# Patient Record
Sex: Male | Born: 1980 | Race: White | Hispanic: No | Marital: Single | Smoking: Never smoker
Health system: Southern US, Community
[De-identification: ages and names within clinical notes are randomized; demographics above are authoritative.]

## PROBLEM LIST (undated history)

## (undated) ENCOUNTER — Emergency Department (HOSPITAL_COMMUNITY): Discharge status: Absent without leave | Payer: Self-pay

## (undated) DIAGNOSIS — R112 Nausea with vomiting, unspecified: Secondary | ICD-10-CM

## (undated) DIAGNOSIS — Z9889 Other specified postprocedural states: Secondary | ICD-10-CM

## (undated) DIAGNOSIS — M109 Gout, unspecified: Secondary | ICD-10-CM

## (undated) DIAGNOSIS — E7800 Pure hypercholesterolemia, unspecified: Secondary | ICD-10-CM

## (undated) HISTORY — PX: TIBIALIS TENDON TRANSFER / REPAIR: SHX6630

## (undated) HISTORY — PX: OTHER SURGICAL HISTORY: SHX169

## (undated) HISTORY — PX: WISDOM TOOTH EXTRACTION: SHX21

---
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
## 2015-01-06 ENCOUNTER — Ambulatory Visit (INDEPENDENT_AMBULATORY_CARE_PROVIDER_SITE_OTHER): Payer: PRIVATE HEALTH INSURANCE | Admitting: Podiatrist

## 2015-02-04 ENCOUNTER — Other Ambulatory Visit: Payer: Self-pay | Admitting: Podiatrist

## 2015-02-04 MED ORDER — PREDNISONE 10 MG (21) PO TBPK: 10.0000 mg | ORAL_TABLET | Freq: Every day | ORAL | Status: DC

## 2015-02-04 MED ORDER — INDOMETHACIN 50 MG PO CAPS: 50.0000 mg | ORAL_CAPSULE | Freq: Three times a day (TID) | ORAL | Status: DC

## 2015-02-04 MED ORDER — COLCHICINE 0.6 MG PO TABS: 0.6000 mg | ORAL_TABLET | Freq: Every day | ORAL | Status: DC

## 2015-02-04 MED ORDER — COLCHICINE 0.6 MG PO TABS: 0.6000 mg | ORAL_TABLET | Freq: Every day | ORAL | Status: AC

## 2016-06-26 ENCOUNTER — Ambulatory Visit: Payer: PRIVATE HEALTH INSURANCE | Admitting: Rheumatology

## 2016-07-11 ENCOUNTER — Ambulatory Visit (INDEPENDENT_AMBULATORY_CARE_PROVIDER_SITE_OTHER): Payer: PRIVATE HEALTH INSURANCE | Admitting: Rheumatology

## 2016-07-11 DIAGNOSIS — M064 Inflammatory polyarthropathy: Secondary | ICD-10-CM

## 2016-07-11 DIAGNOSIS — R6889 Other general symptoms and signs: Secondary | ICD-10-CM | POA: Diagnosis not present

## 2016-07-11 DIAGNOSIS — M25531 Pain in right wrist: Secondary | ICD-10-CM

## 2016-07-11 DIAGNOSIS — M25571 Pain in right ankle and joints of right foot: Secondary | ICD-10-CM | POA: Diagnosis not present

## 2016-07-11 DIAGNOSIS — M1A00X0 Idiopathic chronic gout, unspecified site, without tophus (tophi): Secondary | ICD-10-CM

## 2016-08-14 ENCOUNTER — Other Ambulatory Visit: Payer: Self-pay | Admitting: Rheumatology

## 2016-08-14 MED ORDER — ALLOPURINOL 300 MG PO TABS: 300.0000 mg | ORAL_TABLET | Freq: Every day | ORAL | 1 refills | Status: AC

## 2016-08-14 NOTE — Telephone Encounter (Signed)
07/11/16 last visit Allopurinol 300mg  was sent in 27mo supply have resetn

## 2016-08-14 NOTE — Telephone Encounter (Signed)
[facility] [address]

## 2016-10-11 ENCOUNTER — Ambulatory Visit: Payer: PRIVATE HEALTH INSURANCE | Admitting: Rheumatology

## 2019-01-16 MED FILL — ATORVASTATIN 10 MG TABLET: 10 | 30 days supply | Qty: 30 | Fill #0

## 2019-01-16 MED FILL — ALLOPURINOL 100 MG TABS: 100 | 30 days supply | Qty: 30 | Fill #0

## 2019-02-12 MED FILL — ATORVASTATIN 10 MG TABLET: 10 | 30 days supply | Qty: 30 | Fill #1

## 2019-02-12 MED FILL — ALLOPURINOL 100 MG TABS: 100 | 30 days supply | Qty: 30 | Fill #1

## 2019-02-13 MED FILL — COLCRYS 0.6 MG TABLET: 0.6 | 15 days supply | Qty: 30 | Fill #0

## 2019-03-17 MED FILL — ATORVASTATIN 10 MG TABLET: 10 | 90 days supply | Qty: 90 | Fill #0

## 2019-03-17 MED FILL — ALLOPURINOL 100 MG TABS: 100 | 90 days supply | Qty: 90 | Fill #0

## 2019-06-27 MED FILL — ATORVASTATIN 10 MG TABLET: 10 | 90 days supply | Qty: 90 | Fill #1

## 2019-06-27 MED FILL — ALLOPURINOL 100 MG TABS: 100 | 90 days supply | Qty: 90 | Fill #1

## 2019-10-07 MED FILL — ATORVASTATIN 10 MG TABLET: 10 | 90 days supply | Qty: 90 | Fill #0

## 2019-10-07 MED FILL — ALLOPURINOL 100 MG TABS: 100 | 90 days supply | Qty: 90 | Fill #0

## 2019-11-07 ENCOUNTER — Ambulatory Visit: Payer: Self-pay

## 2019-11-19 ENCOUNTER — Ambulatory Visit: Payer: Self-pay | Attending: Internal Medicine

## 2019-11-19 DIAGNOSIS — Z23 Encounter for immunization: Secondary | ICD-10-CM | POA: Insufficient documentation

## 2019-11-19 NOTE — Progress Notes (Signed)
   Covid-19 Vaccination Clinic  Name:  Rylen Swindler    MRN: [id number] DOB: June 13, 1981  11/19/2019  Mr. Farruggia was observed post Covid-19 immunization for 15 minutes without incidence. He was provided with Vaccine Information Sheet and instruction to access the V-Safe system.   Mr. Showers was instructed to call 911 with any severe reactions post vaccine: Marland Kitchen Difficulty breathing  . Swelling of your face and throat  . A fast heartbeat  . A bad rash all over your body  . Dizziness and weakness    Immunizations Administered    Name Date Dose VIS Date Route   Pfizer COVID-19 Vaccine 11/19/2019  8:51 AM 0.3 mL 09/04/2019 Intramuscular   Manufacturer: [employer], [employer]   Lot: [id number]   NDC: [address]-9

## 2019-12-09 ENCOUNTER — Ambulatory Visit: Payer: Self-pay | Attending: Internal Medicine

## 2019-12-09 DIAGNOSIS — Z23 Encounter for immunization: Secondary | ICD-10-CM

## 2019-12-09 NOTE — Progress Notes (Signed)
   Covid-19 Vaccination Clinic  Name:  Aubert Choyce    MRN: [id number] DOB: 08/19/1981  12/09/2019  Mr. Venable was observed post Covid-19 immunization for 15 minutes without incident. He was provided with Vaccine Information Sheet and instruction to access the V-Safe system.   Mr. Maule was instructed to call 911 with any severe reactions post vaccine: Marland Kitchen Difficulty breathing  . Swelling of face and throat  . A fast heartbeat  . A bad rash all over body  . Dizziness and weakness   Immunizations Administered    Name Date Dose VIS Date Route   Pfizer COVID-19 Vaccine 12/09/2019 11:36 AM 0.3 mL 09/04/2019 Intramuscular   Manufacturer: [employer], [employer]   Lot: [id number]   NDC: [address]-2

## 2019-12-30 ENCOUNTER — Emergency Department (HOSPITAL_COMMUNITY): Payer: PRIVATE HEALTH INSURANCE

## 2019-12-30 ENCOUNTER — Encounter (HOSPITAL_COMMUNITY): Payer: Self-pay | Admitting: Emergency Medicine

## 2019-12-30 ENCOUNTER — Emergency Department (HOSPITAL_COMMUNITY)
Admission: EM | Admit: 2019-12-30 | Discharge: 2019-12-30 | Disposition: A | Discharge status: Home / Self Care | Payer: PRIVATE HEALTH INSURANCE | Attending: Emergency Medicine | Admitting: Emergency Medicine

## 2019-12-30 ENCOUNTER — Other Ambulatory Visit: Payer: Self-pay

## 2019-12-30 DIAGNOSIS — L539 Erythematous condition, unspecified: Secondary | ICD-10-CM | POA: Insufficient documentation

## 2019-12-30 DIAGNOSIS — M25521 Pain in right elbow: Secondary | ICD-10-CM

## 2019-12-30 LAB — CBC
HCT: 42.5 % (ref 39.0–52.0)
Hemoglobin: 13.9 g/dL (ref 13.0–17.0)
MCH: 28.3 pg (ref 26.0–34.0)
MCHC: 32.7 g/dL (ref 30.0–36.0)
MCV: 86.4 fL (ref 80.0–100.0)
Platelets: 252 10*3/uL (ref 150–400)
RBC: 4.92 MIL/uL (ref 4.22–5.81)
RDW: 12.4 % (ref 11.5–15.5)
WBC: 6.8 10*3/uL (ref 4.0–10.5)
nRBC: 0 % (ref 0.0–0.2)

## 2019-12-30 LAB — COMPREHENSIVE METABOLIC PANEL
ALT: 31 U/L (ref 0–44)
AST: 21 U/L (ref 15–41)
Albumin: 4.2 g/dL (ref 3.5–5.0)
Alkaline Phosphatase: 62 U/L (ref 38–126)
Anion gap: 11 (ref 5–15)
BUN: 19 mg/dL (ref 6–20)
CO2: 23 mmol/L (ref 22–32)
Calcium: 9.2 mg/dL (ref 8.9–10.3)
Chloride: 105 mmol/L (ref 98–111)
Creatinine, Ser: 0.95 mg/dL (ref 0.61–1.24)
GFR calc Af Amer: >60 mL/min (ref >60)
GFR calc non Af Amer: >60 mL/min (ref >60)
Glucose, Bld: 94 mg/dL (ref 70–99)
Potassium: 4.1 mmol/L (ref 3.5–5.1)
Sodium: 139 mmol/L (ref 135–145)
Total Bilirubin: 0.6 mg/dL (ref 0.3–1.2)
Total Protein: 7.4 g/dL (ref 6.5–8.1)

## 2019-12-30 LAB — C-REACTIVE PROTEIN: CRP: 2.9 mg/dL — ABNORMAL HIGH (ref <1.0)

## 2019-12-30 LAB — SEDIMENTATION RATE: Sed Rate: 18 mm/hr — ABNORMAL HIGH (ref 0–16)

## 2019-12-30 LAB — URIC ACID: Uric Acid, Serum: 8.2 mg/dL (ref 3.7–8.6)

## 2019-12-30 MED ORDER — SODIUM CHLORIDE 0.9 % IV BOLUS
1000.0000 mL | Freq: Once | INTRAVENOUS | Status: AC
  Administered 2019-12-30: 1000 mL via INTRAVENOUS

## 2019-12-30 MED ORDER — GADOBUTROL 1 MMOL/ML IV SOLN
10.0000 mL | Freq: Once | INTRAVENOUS | Status: AC | PRN
  Administered 2019-12-30: 10 mL via INTRAVENOUS

## 2019-12-30 MED ORDER — SODIUM CHLORIDE 0.9 % IV SOLN
2.0000 g | Freq: Once | INTRAVENOUS | Status: AC
  Administered 2019-12-30: 2 g via INTRAVENOUS
  Filled 2019-12-30: qty 20

## 2019-12-30 NOTE — ED Provider Notes (Signed)
Patient sent here by Dr. Darrelyn Hillock of [facility] for labs, IV antibiotics, MRI of the right elbow.  Recurrent cellulitis, MRI to exclude septic joint.  Dr. Darrelyn Hillock can be reached at [phone] for updates.  Dr. Darrelyn Hillock feels the patient can likely be managed as an outpatient.  Patient is currently in the waiting room, work-up ordered.  Elmer Sow. Pilar Plate, MD [facility] [email or web address]    Sabas Sous, MD 12/30/19 [phone]

## 2019-12-30 NOTE — ED Notes (Signed)
Pt comes out of the room and says " I'm gonna take a piss myself since noone will help," he was asked had he been calling out because the staff that were present at his time of outburst had not heard a request that he needed to go to the bathroom

## 2019-12-30 NOTE — ED Notes (Signed)
Pt taken to MRI  

## 2019-12-30 NOTE — ED Provider Notes (Signed)
[facility] DEPT Provider Note   CSN: [id number] Arrival date & time: 12/30/19  1554     History Chief Complaint  Patient presents with  . Arm Swelling    redness    Reginald Calderon is a 39 y.o. male presents for evaluation of acute onset, persistent right elbow pain and swelling.  He reports that 3 weeks ago on [place] Patrick[place] Day he received the second dose of the Pfizer Covid vaccine to the right upper extremity.  He states that the following day he began to develop swelling, erythema and pain of the left elbow as well as fevers up to 101 F.  The day after that he awoke with a gout flare involving his right ankle, right knee, and right wrist.  He was seen and evaluated by his orthopedist that same day and was started on a 7-day course of doxycycline twice daily.  He reports that the following Monday he was reevaluated and started on a course of prednisone and had significant improvement in his gout flare since then.  He has had gout previously and states that the pain in multiple joints on the right side felt the same.  He notes that by the end of the weekend of 26 March the gout flare and the pain swelling and erythema to the right elbow had resolved.  2 days ago he developed recurrent pain swelling and erythema to the right elbow.  He denies any fevers, numbness or weakness.  The pain will radiate down to the right wrist particularly with flexion of the right wrist.  He denies chest pain, shortness of breath, abdominal pain, nausea, or vomiting.  He has been taking ibuprofen with some improvement in his symptoms.  His orthopedist prescribed him a course of Keflex which he began yesterday and has had 3 doses thus far.  He was seen and evaluated by Dr. Gladstone Lighter in the office today who recommended presentation to the ED for MRI.  The history is provided by the patient.       History reviewed. No pertinent past medical history.  There are no problems to display for  this patient.   History reviewed. No pertinent surgical history.     No family history on file.  Social History   Tobacco Use  . Smoking status: Not on file  Substance Use Topics  . Alcohol use: Not Currently  . Drug use: Not Currently    Home Medications Prior to Admission medications   Medication Sig Start Date End Date Taking? Authorizing Provider  allopurinol (ZYLOPRIM) 100 MG tablet Take 100 mg by mouth every evening. 10/07/19  Yes [provider]  atorvastatin (LIPITOR) 10 MG tablet Take 10 mg by mouth daily. 10/07/19  Yes [provider]  cephALEXin (KEFLEX) 500 MG capsule Take 500 mg by mouth 4 (four) times daily. 12/29/19  Yes [provider]  ibuprofen (ADVIL) 200 MG tablet Take 800 mg by mouth every 6 (six) hours as needed for headache, mild pain or moderate pain.   Yes [provider]  allopurinol (ZYLOPRIM) 300 MG tablet Take 1 tablet (300 mg total) by mouth daily. Patient not taking: Reported on 12/30/2019 08/14/16   Eliezer Lofts, PA-C  colchicine 0.6 MG tablet Take 1 tablet (0.6 mg total) by mouth daily. Take at first sign of gout flare (may take with steroid or nsaid) Patient not taking: Reported on 12/30/2019 02/04/15   Bronson Ing, DPM  indomethacin (INDOCIN) 50 MG capsule Take 1 capsule (  50 mg total) by mouth 3 (three) times daily with meals. Take for a mild gout symptoms- be sure to take with food Patient not taking: Reported on 12/30/2019 02/04/15   Bronson Ing, DPM  predniSONE (STERAPRED UNI-PAK 21 TAB) 10 MG (21) TBPK tablet Take 1 tablet (10 mg total) by mouth daily. Take for moderate to severe gout.  Do not take with other NSAIDS Patient not taking: Reported on 12/30/2019 02/04/15   Bronson Ing, DPM    Allergies    Patient has no known allergies.  Review of Systems   Review of Systems  Constitutional: Negative for chills and fever (resolved).  Respiratory: Negative for shortness of breath.     Cardiovascular: Negative for chest pain.  Gastrointestinal: Negative for abdominal pain, nausea and vomiting.  Musculoskeletal: Positive for arthralgias and joint swelling.  Skin: Positive for color change.  Neurological: Negative for weakness and numbness.  All other systems reviewed and are negative.   Physical Exam Updated Vital Signs BP (!) 123/95   Pulse 92   Temp 98.4 F (36.9 C) (Oral)   Resp 15   Ht '6\' 1"'  (1.854 m)   Wt 99.8 kg   SpO2 97%   BMI 29.03 kg/m   Physical Exam Vitals and nursing note reviewed.  Constitutional:      General: He is not in acute distress.    Appearance: He is well-developed.  HENT:     Head: Normocephalic and atraumatic.  Eyes:     General:        Right eye: No discharge.        Left eye: No discharge.     Conjunctiva/sclera: Conjunctivae normal.  Neck:     Vascular: No JVD.     Trachea: No tracheal deviation.  Cardiovascular:     Rate and Rhythm: Normal rate and regular rhythm.     Pulses: Normal pulses.     Comments: 2+ radial and DP/PT pulses bilaterally, Homans sign absent bilaterally, no lower extremity edema, no palpable cords, compartments are soft  Pulmonary:     Effort: Pulmonary effort is normal.     Breath sounds: Normal breath sounds.  Abdominal:     General: There is no distension.     Palpations: Abdomen is soft.  Musculoskeletal:        General: Swelling and tenderness present.     Comments: Mildly decreased active range of motion at the right elbow, worse pain with flexion than extension.  He has erythema extending from the posterior aspect of the right elbow down to the proximal forearm.  He is maximally tender to palpation along the right medial olecranon process.  The olecranon bursa does not appear markedly edematous.  5/5 strength of BUE major muscle groups, but pain to the elbow elicited with extension against resistance. He also has some tenderness to palpation of the volar aspect of the right wrist but no  swelling, erythema, or reduced range of motion. No crepitus or deformity.   Skin:    General: Skin is warm and dry.     Findings: No erythema.  Neurological:     Mental Status: He is alert.     Comments: Sensation intact to light touch of upper and lower extremities bilaterally  Psychiatric:        Behavior: Behavior normal.     ED Results / Procedures / Treatments   Labs (all labs ordered are listed, but only abnormal results are displayed) Labs Reviewed  SEDIMENTATION RATE -  Abnormal; Notable for the following components:      Result Value   Sed Rate 18 (*)    All other components within normal limits  C-REACTIVE PROTEIN - Abnormal; Notable for the following components:   CRP 2.9 (*)    All other components within normal limits  CULTURE, BLOOD (SINGLE)  CBC  COMPREHENSIVE METABOLIC PANEL  URIC ACID    EKG None  Radiology No results found.  Procedures Procedures (including critical care time)  Medications Ordered in ED Medications  sodium chloride 0.9 % bolus 1,000 mL (0 mLs Intravenous Stopped 12/30/19 2219)  cefTRIAXone (ROCEPHIN) 2 g in sodium chloride 0.9 % 100 mL IVPB (0 g Intravenous Stopped 12/30/19 2015)  gadobutrol (GADAVIST) 1 MMOL/ML injection 10 mL (10 mLs Intravenous Contrast Given 12/30/19 2252)    ED Course  I have reviewed the triage vital signs and the nursing notes.  Pertinent labs & imaging results that were available during my care of the patient were reviewed by me and considered in my medical decision making (see chart for details).  Clinical Course as of Dec 30 2343  Wed Dec 30, 2019  1919 Patient seen by myself as well as PA provider.  Briefly is a 39 year old male with a history of gout which has had in his ankle and his right hand, presented to emergency department with pain and swelling in his right elbow.  Reports this began approximately 2 weeks ago with his right ankle and his right wrist.  He completed a course of prednisone has  significant provement all of his symptoms.  However over the past weekend right elbow became swollen and red again.  He went to see an orthopedist who affirmed the emergency department for blood tests and an MRI of the elbow to evaluate for possible septic joint.  On my exam the patient is comfortable.  He is afebrile.  His vitals are within normal limits.  He does have some mild swelling and redness around the right elbow, with a small and tender effusion of the lateral elbow.  He is able to perform full range of motion both passively and actively with minimal pain.  Based on this presentation, I have a lower concern for septic arthritis.  I think this most consistent with gout or pseudogout.  He has no history or risk factors consistent with gonorrhea.  He has no systemic findings concerning for sepsis.  We plan to obtain the MRI as recommended by his orthopedist and discuss further management options with the orthopedist.   [MT]    Clinical Course User Index [MT] Trifan, Carola Rhine, MD   MDM Rules/Calculators/A&P                      Patient presenting sent from orthopedist office for evaluation of right elbow pain and swelling beginning for 2 days.  Orthopedist is requesting MRI for further evaluation.  The patient is afebrile, vital signs are stable in the ED.  He is neurovascularly intact and compartments are soft.  No concern for necrotizing fasciitis, DVT, osteomyelitis.  He was recently treated for a gout flare though symptoms today involving the right elbow seem atypical for gout.  He has fairly preserved active range of motion of the right elbow joint.  Lab work obtained at triage reviewed and interpreted by myself shows no leukocytosis, no anemia, no metabolic derangements, no renal insufficiency.  ESR and CRP are mildly elevated but these are nonspecific findings.  In the absence  of leukocytosis or constitutional symptoms, and with mostly preserved range of motion I do not feel that his  work-up today is suggestive of septic arthritis.  6:54 PM CONSULT: Spoke with Dr. Gladstone Lighter with orthopedics.  He would like for the patient to receive IV antibiotics and also requested addition of a serum uric acid level.  This was found to be normal in the ED.  The patient was given IV Rocephin in the ED.   9:27PM Dr. Gladstone Lighter called to check in on the status of the MRI.  The patient has not yet been to MRI.  He advises that the patient may be discharged from the ED upon completion of the MRI and does not need to wait for the results.  He advises that he will review the images independently in the morning and that he would like to see the patient in the office tomorrow afternoon.  He would like the patient to continue taking his antibiotics.  On reevaluation patient is resting in bed.  He was informed of Dr. Charlestine Night recommendations and agrees with plan at this time.  He will be discharged when his MRI is completed.  We discussed that he should continue taking the prescribed antibiotics.  Also discussed conservative therapy and management with NSAIDs, Tylenol.  He understands to follow-up with Dr. Gladstone Lighter in the office tomorrow afternoon.  Discussed strict ED return precautions.  Patient verbalized understanding of and agreement with plan and will be stable for discharge home pending MRI.  Patient was seen and evaluated by Dr. Langston Masker who agrees with assessment and plan at this time  Final Clinical Impression(s) / ED Diagnoses Final diagnoses:  Right elbow pain  Erythema of skin    Rx / DC Orders ED Discharge Orders    None       Debroah Baller 12/30/19 2355    Wyvonnia Dusky, MD 12/31/19 [phone]

## 2019-12-30 NOTE — Discharge Instructions (Signed)
You can take 1 to 2 tablets of Tylenol (350mg -1000mg  depending on the dose) every 6 hours as needed for pain.  Do not exceed 4000 mg of Tylenol daily.  If your pain persists you can take a doses of ibuprofen in between doses of Tylenol.  I usually recommend 400 to 600 mg of ibuprofen every 6 hours.  Take this with food to avoid upset stomach issues.  Please take all of your antibiotics until finished!   Take your antibiotics with food.  Common side effects of antibiotics include nausea, vomiting, abdominal discomfort, and diarrhea. You may help offset some of this with probiotics which you can buy or get in yogurt. Do not eat  or take the probiotics until 2 hours after your antibiotic.    Dr. has advised that he will review your images in the morning and recommends discharge from the emergency department. He would like to see you in the office tomorrow and recommends arriving at the office between 2:00 and 2:30 PM.  Return to the emergency department if any concerning signs or symptoms develop such as fevers, severe swelling, inability to range the elbow or weakness.

## 2019-12-30 NOTE — ED Triage Notes (Signed)
Arrived C/C R arm/elbow tissue swelling, pain and inflammation. States this has progressed since he got his last COVID shot on St. Patrick's day. Has had prednisone and abx, and it has returned.

## 2019-12-30 NOTE — ED Notes (Signed)
Bero, EDP made aware patient needs MRI and labs sent by pcp.

## 2019-12-30 NOTE — ED Notes (Signed)
Patient came out of room agitated and demanding this RN take his IV out now. Followed patient in to room to inquire why the change in disposition as patient was pleasant and cooperative at shift change. Patient said "I've been waiting 30 minutes to piss and I was told you were too busy to take me to the bathroom. I almost pissed in the sink. I was also told at shift change I was next for MRI and still waiting." Deescalated situation and patient later apologized. Called MRI who informed this RN that a patient needed a stat MRI and bumped Reginald Calderon. Informed patient. Patient is now in MRI.

## 2019-12-30 NOTE — ED Notes (Signed)
Patient took 800 mg of his own supply of ibuprofen for arm pain, per PA approval. Gave gingerale and offered an ice pack. Patient says Orthopedic MD said not to ice so he denies ice

## 2019-12-31 ENCOUNTER — Ambulatory Visit (HOSPITAL_COMMUNITY)
Admission: RE | Admit: 2019-12-31 | Discharge: 2019-12-31 | Disposition: A | Discharge status: Home / Self Care | Payer: PRIVATE HEALTH INSURANCE | Source: Ambulatory Visit | Attending: Orthopaedic Surgery | Admitting: Orthopaedic Surgery

## 2019-12-31 ENCOUNTER — Ambulatory Visit (HOSPITAL_COMMUNITY): Payer: PRIVATE HEALTH INSURANCE | Admitting: Anesthesiology

## 2019-12-31 ENCOUNTER — Other Ambulatory Visit: Payer: Self-pay

## 2019-12-31 ENCOUNTER — Other Ambulatory Visit (HOSPITAL_COMMUNITY)
Admission: RE | Admit: 2019-12-31 | Discharge: 2019-12-31 | Disposition: A | Discharge status: Home / Self Care | Payer: PRIVATE HEALTH INSURANCE | Source: Ambulatory Visit | Attending: Orthopaedic Surgery | Admitting: Orthopaedic Surgery

## 2019-12-31 ENCOUNTER — Encounter (HOSPITAL_COMMUNITY)
Admission: RE | Disposition: A | Discharge status: Home / Self Care | Payer: Self-pay | Source: Ambulatory Visit | Attending: Orthopaedic Surgery

## 2019-12-31 ENCOUNTER — Encounter (HOSPITAL_COMMUNITY): Payer: Self-pay | Admitting: Orthopaedic Surgery

## 2019-12-31 DIAGNOSIS — Z7952 Long term (current) use of systemic steroids: Secondary | ICD-10-CM | POA: Insufficient documentation

## 2019-12-31 DIAGNOSIS — Z20822 Contact with and (suspected) exposure to covid-19: Secondary | ICD-10-CM | POA: Insufficient documentation

## 2019-12-31 DIAGNOSIS — M109 Gout, unspecified: Secondary | ICD-10-CM | POA: Insufficient documentation

## 2019-12-31 DIAGNOSIS — Z791 Long term (current) use of non-steroidal anti-inflammatories (NSAID): Secondary | ICD-10-CM | POA: Diagnosis not present

## 2019-12-31 DIAGNOSIS — M71121 Other infective bursitis, right elbow: Secondary | ICD-10-CM | POA: Insufficient documentation

## 2019-12-31 DIAGNOSIS — Z79899 Other long term (current) drug therapy: Secondary | ICD-10-CM | POA: Insufficient documentation

## 2019-12-31 DIAGNOSIS — E7800 Pure hypercholesterolemia, unspecified: Secondary | ICD-10-CM | POA: Diagnosis not present

## 2019-12-31 HISTORY — PX: I & D EXTREMITY: SHX5045

## 2019-12-31 HISTORY — DX: Nausea with vomiting, unspecified: R11.2

## 2019-12-31 HISTORY — DX: Pure hypercholesterolemia, unspecified: E78.00

## 2019-12-31 HISTORY — DX: Gout, unspecified: M10.9

## 2019-12-31 HISTORY — DX: Other specified postprocedural states: Z98.890

## 2019-12-31 LAB — RESPIRATORY PANEL BY RT PCR (FLU A&B, COVID)
Influenza A by PCR: NEGATIVE
Influenza B by PCR: NEGATIVE
SARS Coronavirus 2 by RT PCR: NEGATIVE

## 2019-12-31 SURGERY — IRRIGATION AND DEBRIDEMENT EXTREMITY
Anesthesia: Monitor Anesthesia Care | Site: Arm Upper | Laterality: Right

## 2019-12-31 MED ORDER — INDOMETHACIN 50 MG PO CAPS: 50.0000 mg | ORAL_CAPSULE | Freq: Three times a day (TID) | ORAL | 1 refills | Status: AC

## 2019-12-31 MED ORDER — OXYCODONE HCL 5 MG PO TABS: 5.0000 mg | ORAL_TABLET | Freq: Once | ORAL | Status: DC | PRN

## 2019-12-31 MED ORDER — PROPOFOL 500 MG/50ML IV EMUL
INTRAVENOUS | Status: DC | PRN
  Administered 2019-12-31 (×2): 100 ug/kg/min via INTRAVENOUS

## 2019-12-31 MED ORDER — CLONIDINE HCL (ANALGESIA) 100 MCG/ML EP SOLN
EPIDURAL | Status: DC | PRN
  Administered 2019-12-31: 100 ug

## 2019-12-31 MED ORDER — FENTANYL CITRATE (PF) 250 MCG/5ML IJ SOLN
INTRAMUSCULAR | Status: AC
  Filled 2019-12-31: qty 5

## 2019-12-31 MED ORDER — BUPIVACAINE-EPINEPHRINE (PF) 0.5% -1:200000 IJ SOLN
INTRAMUSCULAR | Status: DC | PRN
  Administered 2019-12-31: 30 mL via PERINEURAL

## 2019-12-31 MED ORDER — ACETAMINOPHEN 500 MG PO TABS
1000.0000 mg | ORAL_TABLET | Freq: Once | ORAL | Status: AC
  Administered 2019-12-31: 1000 mg via ORAL
  Filled 2019-12-31: qty 2

## 2019-12-31 MED ORDER — CHLORHEXIDINE GLUCONATE 4 % EX LIQD: 60.0000 mL | Freq: Once | CUTANEOUS | Status: DC

## 2019-12-31 MED ORDER — CLINDAMYCIN HCL 300 MG PO CAPS: 300.0000 mg | ORAL_CAPSULE | Freq: Three times a day (TID) | ORAL | 0 refills | Status: DC

## 2019-12-31 MED ORDER — LACTATED RINGERS IV SOLN: INTRAVENOUS | Status: DC

## 2019-12-31 MED ORDER — MIDAZOLAM HCL 2 MG/2ML IJ SOLN
INTRAMUSCULAR | Status: AC
  Filled 2019-12-31: qty 2

## 2019-12-31 MED ORDER — FENTANYL CITRATE (PF) 100 MCG/2ML IJ SOLN: 25.0000 ug | INTRAMUSCULAR | Status: DC | PRN

## 2019-12-31 MED ORDER — FENTANYL CITRATE (PF) 100 MCG/2ML IJ SOLN
INTRAMUSCULAR | Status: DC | PRN
  Administered 2019-12-31 (×2): 25 ug via INTRAVENOUS

## 2019-12-31 MED ORDER — 0.9 % SODIUM CHLORIDE (POUR BTL) OPTIME
TOPICAL | Status: DC | PRN
  Administered 2019-12-31: 19:00:00 1000 mL

## 2019-12-31 MED ORDER — VANCOMYCIN HCL IN DEXTROSE 1-5 GM/200ML-% IV SOLN
INTRAVENOUS | Status: AC
  Administered 2019-12-31: 1000 mg via INTRAVENOUS
  Filled 2019-12-31: qty 200

## 2019-12-31 MED ORDER — CLINDAMYCIN HCL 300 MG PO CAPS: 300.0000 mg | ORAL_CAPSULE | Freq: Three times a day (TID) | ORAL | 0 refills | Status: AC

## 2019-12-31 MED ORDER — VANCOMYCIN HCL IN DEXTROSE 1-5 GM/200ML-% IV SOLN: 1000.0000 mg | INTRAVENOUS | Status: AC

## 2019-12-31 MED ORDER — FENTANYL CITRATE (PF) 100 MCG/2ML IJ SOLN: 50.0000 ug | Freq: Once | INTRAMUSCULAR | Status: DC

## 2019-12-31 MED ORDER — FENTANYL CITRATE (PF) 100 MCG/2ML IJ SOLN
INTRAMUSCULAR | Status: AC
  Filled 2019-12-31: qty 2

## 2019-12-31 MED ORDER — INDOMETHACIN 50 MG PO CAPS: 50.0000 mg | ORAL_CAPSULE | Freq: Three times a day (TID) | ORAL | 1 refills | Status: DC

## 2019-12-31 MED ORDER — PROMETHAZINE HCL 25 MG/ML IJ SOLN: 6.2500 mg | INTRAMUSCULAR | Status: DC | PRN

## 2019-12-31 MED ORDER — SODIUM CHLORIDE 0.9 % IR SOLN
Status: DC | PRN
  Administered 2019-12-31: 3000 mL

## 2019-12-31 MED ORDER — MIDAZOLAM HCL 2 MG/2ML IJ SOLN: 2.0000 mg | Freq: Once | INTRAMUSCULAR | Status: DC

## 2019-12-31 MED ORDER — HYDROCODONE-ACETAMINOPHEN 5-325 MG PO TABS: 1.0000 | ORAL_TABLET | ORAL | 0 refills | Status: AC | PRN

## 2019-12-31 MED ORDER — POVIDONE-IODINE 10 % EX SWAB: 2.0000 "application " | Freq: Once | CUTANEOUS | Status: DC

## 2019-12-31 MED ORDER — HYDROCODONE-ACETAMINOPHEN 5-325 MG PO TABS: 1.0000 | ORAL_TABLET | ORAL | 0 refills | Status: DC | PRN

## 2019-12-31 MED ORDER — OXYCODONE HCL 5 MG/5ML PO SOLN: 5.0000 mg | Freq: Once | ORAL | Status: DC | PRN

## 2019-12-31 MED ORDER — ONDANSETRON HCL 4 MG/2ML IJ SOLN
INTRAMUSCULAR | Status: DC | PRN
  Administered 2019-12-31: 4 mg via INTRAVENOUS

## 2019-12-31 MED ORDER — PROPOFOL 10 MG/ML IV BOLUS
INTRAVENOUS | Status: DC | PRN
  Administered 2019-12-31 (×4): 30 mg via INTRAVENOUS

## 2019-12-31 SURGICAL SUPPLY — 56 items
APL PRP STRL LF DISP 70% ISPRP (MISCELLANEOUS) ×1
BNDG CMPR 9X4 STRL LF SNTH (GAUZE/BANDAGES/DRESSINGS)
BNDG ELASTIC 4X5.8 VLCR STR LF (GAUZE/BANDAGES/DRESSINGS) ×7 IMPLANT
BNDG ESMARK 4X9 LF (GAUZE/BANDAGES/DRESSINGS) IMPLANT
BNDG GAUZE ELAST 4 BULKY (GAUZE/BANDAGES/DRESSINGS) ×5 IMPLANT
CHLORAPREP W/TINT 26 (MISCELLANEOUS) ×3 IMPLANT
CNTNR URN SCR LID CUP LEK RST (MISCELLANEOUS) IMPLANT
CONT SPEC 4OZ STRL OR WHT (MISCELLANEOUS) ×3
CORD BIPOLAR FORCEPS 12FT (ELECTRODE) ×3 IMPLANT
COVER SURGICAL LIGHT HANDLE (MISCELLANEOUS) ×3 IMPLANT
COVER WAND RF STERILE (DRAPES) ×3 IMPLANT
CUFF TOURN SGL QUICK 18X4 (TOURNIQUET CUFF) ×3 IMPLANT
CUFF TOURN SGL QUICK 24 (TOURNIQUET CUFF)
CUFF TRNQT CYL 24X4X16.5-23 (TOURNIQUET CUFF) IMPLANT
DRAPE U-SHAPE 47X51 STRL (DRAPES) ×3 IMPLANT
GAUZE SPONGE 4X4 12PLY STRL (GAUZE/BANDAGES/DRESSINGS) ×3 IMPLANT
GAUZE SPONGE 4X4 12PLY STRL LF (GAUZE/BANDAGES/DRESSINGS) ×2 IMPLANT
GAUZE XEROFORM 5X9 LF (GAUZE/BANDAGES/DRESSINGS) ×3 IMPLANT
GLOVE BIOGEL PI IND STRL 7.5 (GLOVE) IMPLANT
GLOVE BIOGEL PI IND STRL 8 (GLOVE) ×1 IMPLANT
GLOVE BIOGEL PI INDICATOR 7.5 (GLOVE) ×2
GLOVE BIOGEL PI INDICATOR 8 (GLOVE) ×2
GLOVE SURG SYN 7.5  E (GLOVE) ×3
GLOVE SURG SYN 7.5 E (GLOVE) ×1 IMPLANT
GLOVE SURG SYN 7.5 PF PI (GLOVE) ×1 IMPLANT
GOWN STRL REUS W/ TWL LRG LVL3 (GOWN DISPOSABLE) ×1 IMPLANT
GOWN STRL REUS W/TWL LRG LVL3 (GOWN DISPOSABLE) ×3
KIT BASIN OR (CUSTOM PROCEDURE TRAY) ×3 IMPLANT
KIT TURNOVER KIT B (KITS) ×3 IMPLANT
MANIFOLD NEPTUNE II (INSTRUMENTS) ×3 IMPLANT
NDL HYPO 25GX1X1/2 BEV (NEEDLE) IMPLANT
NEEDLE HYPO 25GX1X1/2 BEV (NEEDLE) IMPLANT
NS IRRIG 1000ML POUR BTL (IV SOLUTION) ×3 IMPLANT
PACK ORTHO EXTREMITY (CUSTOM PROCEDURE TRAY) ×3 IMPLANT
PAD ARMBOARD 7.5X6 YLW CONV (MISCELLANEOUS) ×3 IMPLANT
PAD CAST 4YDX4 CTTN HI CHSV (CAST SUPPLIES) ×1 IMPLANT
PADDING CAST COTTON 4X4 STRL (CAST SUPPLIES) ×3
PADDING CAST SYNTHETIC 4 (CAST SUPPLIES) ×2
PADDING CAST SYNTHETIC 4X4 STR (CAST SUPPLIES) IMPLANT
SET CYSTO W/LG BORE CLAMP LF (SET/KITS/TRAYS/PACK) ×3 IMPLANT
SLING ARM IMMOBILIZER XL (CAST SUPPLIES) ×2 IMPLANT
SPLINT FIBERGLASS 4X30 (CAST SUPPLIES) ×2 IMPLANT
SPONGE LAP 4X18 RFD (DISPOSABLE) ×3 IMPLANT
SUT MNCRL AB 3-0 PS2 18 (SUTURE) ×2 IMPLANT
SUT PROLENE 4 0 PS 2 18 (SUTURE) ×6 IMPLANT
SWAB CULTURE ESWAB REG 1ML (MISCELLANEOUS) ×2 IMPLANT
SYR CONTROL 10ML LL (SYRINGE) IMPLANT
TOWEL GREEN STERILE (TOWEL DISPOSABLE) ×3 IMPLANT
TOWEL GREEN STERILE FF (TOWEL DISPOSABLE) ×3 IMPLANT
TUBE CONNECTING 12'X1/4 (SUCTIONS) ×1
TUBE CONNECTING 12X1/4 (SUCTIONS) ×2 IMPLANT
TUBING TUR DISP (UROLOGICAL SUPPLIES) IMPLANT
UNDERPAD 30X30 (UNDERPADS AND DIAPERS) ×3 IMPLANT
UNDERPAD 30X36 HEAVY ABSORB (UNDERPADS AND DIAPERS) ×3 IMPLANT
WATER STERILE IRR 1000ML POUR (IV SOLUTION) ×3 IMPLANT
YANKAUER SUCT BULB TIP NO VENT (SUCTIONS) ×3 IMPLANT

## 2019-12-31 NOTE — Anesthesia Preprocedure Evaluation (Addendum)
Anesthesia Evaluation  Patient identified by MRN, date of birth, ID band Patient awake    Reviewed: Allergy & Precautions, NPO status , Patient's Chart, lab work & pertinent test results  History of Anesthesia Complications (+) PONV and history of anesthetic complications  Airway Mallampati: II  TM Distance: >3 FB Neck ROM: Full    Dental no notable dental hx.    Pulmonary neg pulmonary ROS,    Pulmonary exam normal        Cardiovascular negative cardio ROS Normal cardiovascular exam     Neuro/Psych negative neurological ROS  negative psych ROS   GI/Hepatic negative GI ROS, Neg liver ROS,   Endo/Other  negative endocrine ROS  Renal/GU negative Renal ROS  negative genitourinary   Musculoskeletal Gout, right elbow cellulitis   Abdominal   Peds  Hematology negative hematology ROS (+)   Anesthesia Other Findings Day of surgery medications reviewed with patient.  Reproductive/Obstetrics negative OB ROS                            Anesthesia Physical Anesthesia Plan  ASA: II  Anesthesia Plan: Regional and MAC   Post-op Pain Management:    Induction:   PONV Risk Score and Plan: 2 and Treatment may vary due to age or medical condition, Propofol infusion, Ondansetron and Midazolam  Airway Management Planned: Natural Airway and Simple Face Mask  Additional Equipment: None  Intra-op Plan:   Post-operative Plan:   Informed Consent: I have reviewed the patients History and Physical, chart, labs and discussed the procedure including the risks, benefits and alternatives for the proposed anesthesia with the patient or authorized representative who has indicated his/her understanding and acceptance.       Plan Discussed with: CRNA  Anesthesia Plan Comments:       Anesthesia Quick Evaluation

## 2019-12-31 NOTE — H&P (Signed)
ORTHOPAEDIC H&P  PCP:  Georgann Housekeeper, MD  Chief Complaint: Right elbow erythema, pain and swelling  HPI: Reginald Calderon is a 39 y.o. male who complains of right elbow erythema, pain and swelling.  This for started up earlier this month in which she had some pain and swelling around the posterior medial aspect of the elbow.  He was initially treated by my partner who placed him on some oral antibiotics.  This resolved spontaneously.  He then had a gouty flare which caused him to have some pain and some other joints.  This was treated with some prednisone.  After having resolution of the gout flare, he did have a recurrence of the pain, erythema and swelling about the right elbow.  He was subsequently changed to Keflex and then a subsequent MRI was obtained of the right elbow.  This showed some extensive cellulitis around the medial elbow as well as a fluid collection in the olecranon bursa.  He had significant point tenderness over the posterior elbow as well as a small, fluctuance in this area.  I had a long discussion with him in clinic regarding treatment options and he did wish to proceed forward with surgery for an irrigation debridement and presents today for that.  Past Medical History:  Diagnosis Date   Gout    High cholesterol    PONV (postoperative nausea and vomiting)    Past Surgical History:  Procedure Laterality Date   achilles     TIBIALIS TENDON TRANSFER / REPAIR Right    x2   WISDOM TOOTH EXTRACTION     Social History   Socioeconomic History   Marital status: Single    Spouse name: Not on file   Number of children: Not on file   Years of education: Not on file   Highest education level: Not on file  Occupational History   Not on file  Tobacco Use   Smoking status: Never Smoker   Smokeless tobacco: Never Used  Substance and Sexual Activity   Alcohol use: Not Currently   Drug use: Not Currently   Sexual activity: Not on file  Other Topics  Concern   Not on file  Social History Narrative   Not on file   Social Determinants of Health   Financial Resource Strain:    Difficulty of Paying Living Expenses:   Food Insecurity:    Worried About Running Out of Food in the Last Year:    Barista in the Last Year:   Transportation Needs:    Freight forwarder (Medical):    Lack of Transportation (Non-Medical):   Physical Activity:    Days of Exercise per Week:    Minutes of Exercise per Session:   Stress:    Feeling of Stress :   Social Connections:    Frequency of Communication with Friends and Family:    Frequency of Social Gatherings with Friends and Family:    Attends Religious Services:    Active Member of Clubs or Organizations:    Attends Banker Meetings:    Marital Status:    History reviewed. No pertinent family history. No Known Allergies Prior to Admission medications   Medication Sig Start Date End Date Taking? Authorizing Provider  allopurinol (ZYLOPRIM) 100 MG tablet Take 100 mg by mouth every evening. 10/07/19  Yes [provider]  allopurinol (ZYLOPRIM) 300 MG tablet Take 1 tablet (300 mg total) by mouth daily. 08/14/16  Yes Tawni Pummel, PA-C  atorvastatin (LIPITOR) 10 MG tablet Take 10 mg by mouth daily. 10/07/19  Yes [provider]  cephALEXin (KEFLEX) 500 MG capsule Take 500 mg by mouth 4 (four) times daily. 12/29/19  Yes [provider]  ibuprofen (ADVIL) 200 MG tablet Take 800 mg by mouth every 6 (six) hours as needed for headache, mild pain or moderate pain.   Yes [provider]  colchicine 0.6 MG tablet Take 1 tablet (0.6 mg total) by mouth daily. Take at first sign of gout flare (may take with steroid or nsaid) Patient not taking: Reported on 12/30/2019 02/04/15   Delories Heinz, DPM  indomethacin (INDOCIN) 50 MG capsule Take 1 capsule (50 mg total) by mouth 3 (three) times daily with meals. Take for a mild gout symptoms-  be sure to take with food Patient not taking: Reported on 12/30/2019 02/04/15   Delories Heinz, DPM  predniSONE (STERAPRED UNI-PAK 21 TAB) 10 MG (21) TBPK tablet Take 1 tablet (10 mg total) by mouth daily. Take for moderate to severe gout.  Do not take with other NSAIDS Patient not taking: Reported on 12/30/2019 02/04/15   Delories Heinz, DPM   MR ELBOW RIGHT W WO CONTRAST  Result Date: 12/31/2019 CLINICAL DATA:  Pain, swelling and inflammation involving the elbow. EXAM: MRI OF THE RIGHT ELBOW WITHOUT AND WITH CONTRAST TECHNIQUE: Multiplanar, multisequence MR imaging of the elbow was performed before and after the administration of intravenous contrast. CONTRAST:  27mL GADAVIST GADOBUTROL 1 MMOL/ML IV SOLN COMPARISON:  None. FINDINGS: Fairly significant subcutaneous soft tissue swelling/edema/fluid involving the posterior and medial aspect of the elbow, lower humeral area and proximal forearm. Findings consistent with significant cellulitis. There is also a small rim enhancing fluid collection in the region of the olecranon bursa suspicious for small abscess or septic bursitis. This measures a maximum of 2.2 cm and may be the epicenter of this process. There is also a small focal fluid collection in the medial antecubital region which also demonstrates rim enhancement after contrast administration and is consistent with a small subcutaneous abscess measuring a maximum of 18 mm. Moderate inflammation/edema involving the triceps musculature distally and mainly medially consistent with myositis. No findings suspicious for pyomyositis. There is also some signal abnormality in the triceps tendon which could be inflammatory/infectious tendinopathy. I do not see any findings suspicious for osteomyelitis. There is a small elbow joint effusion but I do not see any worrisome enhancement to suggest septic arthritis. IMPRESSION: 1. Diffuse cellulitis. There is also myositis involving the medial triceps musculature.  Possible infectious tendinopathy involving the triceps tendon. 2. Small subcutaneous abscess or septic bursitis involving the olecranon bursa. 3. Small second small subcutaneous abscess involving the medial antecubital region. 4. No findings for pyomyositis, septic arthritis or osteomyelitis. Electronically Signed   By: Rudie Meyer M.D.   On: 12/31/2019 07:51    Positive ROS: All other systems have been reviewed and were otherwise negative with the exception of those mentioned in the HPI and as above.  Physical Exam: General: Alert, no acute distress Cardiovascular: No pedal edema Respiratory: No cyanosis, no use of accessory musculature Skin: No lesions in the area of chief complaint Psychiatric: Patient is competent for consent with normal mood and affect  MUSCULOSKELETAL: Examination of the right upper extremity shows mildly swollen elbow and wrist.  There is erythema along the posterior medial aspect of the elbow.  He has point tenderness over the tip of the olecranon and a small, area of fluid collection  can be palpated.  He has intact, gentle range of motion of the elbow including flexion, extension, pronation and supination and an arc of approximately 50 degrees in all directions.  He has pain at the extremes of this motion.  He also has mild tenderness palpation around the wrist distally.  This is most significant over the distal ulna.  No palpable fluid collection is felt at the wrist or along the medial elbow.  Distally in his hand his fingertips are warm well perfused with brisk capillary refill.  His sensation is grossly intact light touch throughout all digits.  Assessment: Right elbow septic olecranon bursitis and secondary cellulitis  Plan: I had a long discussion with him in clinic today.  He does have fluid collection seen on his MRI.  He has undergone 2 different rounds of oral antibiotics with continued pain, swelling and no significant provement in his symptoms.  Because of  this we did discuss proceeding forward with exploration of his posterior elbow and irrigation debridement and bursectomy.  We discussed the risk and benefits of this in clinic which include but are not limited to infection, bleeding, damage to surrounding structures including blood vessels and nerves, pain, stiffness, recurrence and need for additional procedures.  Additionally risks include creating an draining sinus tract, wound healing complications.  After having this discussion he did wish to proceed and presents today for that.  Informed consent was obtained here in the preoperative area and his right arm was marked.  We will plan for likely discharge home postoperatively pending intraoperative findings.  He will have close follow-up with me next week.    Verner Mould, MD [phone]   12/31/2019 6:07 PM

## 2019-12-31 NOTE — Transfer of Care (Signed)
Immediate Anesthesia Transfer of Care Note  Patient: Reginald Calderon  Procedure(s) Performed: IRRIGATION AND DEBRIDEMENT OF ELBOW WITH BURSECTOMY (Right Arm Upper)  Patient Location: PACU  Anesthesia Type:MAC combined with regional for post-op pain  Level of Consciousness: awake, alert  and oriented  Airway & Oxygen Therapy: Patient Spontanous Breathing  Post-op Assessment: Report given to RN and Post -op Vital signs reviewed and stable  Post vital signs: Reviewed and stable  Last Vitals:  Vitals Value Taken Time  BP 123/76 12/31/19 1926  Temp    Pulse 76 12/31/19 1929  Resp 14 12/31/19 1929  SpO2 97 % 12/31/19 1929  Vitals shown include unvalidated device data.  Last Pain:  Vitals:   12/31/19 1611  PainSc: 6       Patients Stated Pain Goal: 0 (38/88/75 [id number])  Complications: No apparent anesthesia complications

## 2019-12-31 NOTE — Op Note (Signed)
PREOPERATIVE DIAGNOSIS: Right elbow septic olecranon bursitis  POSTOPERATIVE DIAGNOSIS: Right olecranon septic olecranon bursitis and and likely overlying gout  ATTENDING PHYSICIAN: Maudry Mayhew. Jeannie Fend, III, MD who was present and scrubbed for the entire case   ASSISTANT SURGEON: None.   ANESTHESIA: Regional with MAC  SURGICAL PROCEDURES: 1.  Right posterior elbow irrigation debridement of skin and subcutaneous tissue 2.  Right elbow radical bursectomy  SURGICAL INDICATIONS: Patient is a 39 year old male who was seen and evaluated by me in clinic today.  2 days after his COVID-19 vaccine earlier this month he developed pain and redness as well as swelling around his right elbow.  He was initially seen and treated by my partner who placed him on oral doxycycline.  At this provided the resolution of his symptoms which stayed absent for nearly 10 to 12 days.  After that he had significant increase in his pain once again around his right elbow.  Associated with this he had redness and swelling to the elbow as well.  Most of the pain isolated to the posterior aspect over the tip of the olecranon.  He subsequently was placed on a more oral antibiotics as well as oral prednisone.  This helped some with his symptoms but then he had continued pain and redness to the arm.  He was subsequently sent to the ER last night where he received a dose of IV antibiotics as well as received an MRI of the right elbow.  After receiving the antibiotics he noted may be some mild improvement in the erythema but no significant change in his pain to the elbow.  He was then sent to see me in clinic today.  On exam in clinic today he had point tenderness over the tip of the olecranon.  This area was full with some mild fluctuance.  His MRI was reviewed which showed rim-enhancing lesion over the tip of the olecranon consistent with subcutaneous abscess/infected, septic olecranon bursitis.  I had a long discussion with him today in  clinic regarding both operative and nonoperative treatment measures.  The patient did wish to proceed forward with operative intervention as he felt that he had been undergoing a significant amount of nonoperative treatment up to this point without evidence of significant improvement.  Because this we discussed the risks and benefits of surgical intervention in clinic and he presents today for right elbow I&D and bursectomy.  FINDING: There was chalky, white material throughout the posterior aspect of the elbow.  There was a thickened olecranon bursa which did have some serous fluid within it as well as abundant, chalky white fluid.  This did appear to be consistent with gout.  Bursectomy was performed successfully as was irrigation debridement of the posterior elbow.  DESCRIPTION OF PROCEDURE: The patient was identified in the preoperative holding area where the risk benefits and alternatives of the procedure were once again discussed with the patient.  These include but are not limited to infection, bleeding, damage to surrounding structures including blood vessels and nerves, pain, stiffness, recurrence, wound healing issues, skin necrosis and need for additional procedures.  Informed consent was obtained that time the patient's right elbow was marked with a surgical marking pen.  He then underwent a right upper extremity plexus block by anesthesia.  He was brought to the operative suite where timeout was performed identifying the correct patient operative site.  He was positioned supine on the operative table with his hand outstretched on a hand table.  He was given a  dose of IV vancomycin.  A tourniquet was placed on the upper arm and the patient was induced under MAC sedation.  The right upper extremity was then prepped and draped in usual sterile fashion.  The limb was exsanguinated by gravity and the tourniquet was inflated.  A curved incision was then made over the posterior lateral aspect of the  elbow.  Blunt dissection was carried down to the subcutaneous tissues.  There was a thickened olecranon bursa which was surrounded by chalky, white material.  Blunt dissection was performed throughout the bursa dissecting it and releasing it from the overlying skin and underlying triceps tendon and olecranon.  Care was taken to protect the tendon itself.  The bursa was opened and there was some serous fluid within it which was cultured for both aerobic and anaerobic bacteria.  A portion of the bursa was sent to pathology for microscopic analysis.  Dissection was then continued circumferentially around the bursa releasing it from its soft tissue constraints and removing it in its entirety.  The some residual chalky white material was debrided and removed utilizing a rondure.  Debridement included skin and subcutaneous tissue which was done sharply with pickups and scissors as well as a rondure.  At this point the wound was then copiously irrigated with 3 L of normal saline via cystoscopy tubing.  The tourniquet was released and hemostasis was achieved.  The patient had return of brisk capillary refill to all of his digits.  The skin was closed with deep, interrupted 3-0 Monocryl sutures followed by interrupted 4-0 Prolene sutures in a half buried vertical mattress fashion.  Xeroform, 4 x 4's, ABD and a well-padded long-arm splint was placed.  The splint was placed in approximately 30 degrees of elbow flexion.  He was awoken from his anesthesia and taken the PACU in stable condition.  He tolerated the procedure well and there were no complications.  ESTIMATED BLOOD LOSS: 30 mL  TOURNIQUET TIME: 21 minutes  SPECIMENS: Aerobic and anaerobic cultures x1, right posterior elbow bursitis for pathology  POSTOPERATIVE PLAN: The patient will be discharged home today.  He will be sent home with oral antibiotics as well as indomethacin to cover both septic olecranon bursitis, cellulitis as well as his likely gouty flare.   I will plan to see him back next week in clinic for wound check.  He was encouraged to call at any point over the weekend should his symptoms worsen or fail to further improve.  Strict return instructions were discussed with his wife postoperatively.  IMPLANTS: None

## 2019-12-31 NOTE — Anesthesia Procedure Notes (Signed)
Anesthesia Regional Block: Supraclavicular block   Pre-Anesthetic Checklist: ,, timeout performed, Correct Patient, Correct Site, Correct Laterality, Correct Procedure, Correct Position, site marked, Risks and benefits discussed, pre-op evaluation,  At surgeon's request and post-op pain management  Laterality: Right  Prep: Maximum Sterile Barrier Precautions used, chloraprep       Needles:  Injection technique: Single-shot  Needle Type: Echogenic Stimulator Needle     Needle Length: 9cm  Needle Gauge: 22     Additional Needles:   Procedures:,,,, ultrasound used (permanent image in chart),,,,  Narrative:  Start time: 12/31/2019 5:28 PM End time: 12/31/2019 5:31 PM Injection made incrementally with aspirations every 5 mL.  Performed by: Personally  Anesthesiologist: Kaylyn Layer, MD  Additional Notes: Risks, benefits, and alternative discussed. Patient gave consent for procedure. Patient prepped and draped in sterile fashion. Sedation administered, patient remains easily responsive to voice. Relevant anatomy identified with ultrasound guidance. Local anesthetic given in 5cc increments with no signs or symptoms of intravascular injection. No pain or paraesthesias with injection. Patient monitored throughout procedure with signs of LAST or immediate complications. Tolerated well. Ultrasound image placed in chart.  Reginald Greenhouse, MD

## 2020-01-01 NOTE — Anesthesia Postprocedure Evaluation (Signed)
Anesthesia Post Note  Patient: Reginald Calderon  Procedure(s) Performed: IRRIGATION AND DEBRIDEMENT OF ELBOW WITH BURSECTOMY (Right Arm Upper)     Patient location during evaluation: PACU Anesthesia Type: Regional Level of consciousness: awake and alert Pain management: pain level controlled Vital Signs Assessment: post-procedure vital signs reviewed and stable Respiratory status: spontaneous breathing, nonlabored ventilation, respiratory function stable and patient connected to nasal cannula oxygen Cardiovascular status: stable and blood pressure returned to baseline Postop Assessment: no apparent nausea or vomiting Anesthetic complications: no    Last Vitals:  Vitals:   12/31/19 1937 12/31/19 1940  BP: 124/74 119/83  Pulse: 74 74  Resp: 16 16  Temp:  36.7 C  SpO2: 96% 100%    Last Pain:  Vitals:   12/31/19 1940  PainSc: 0-No pain                 Monic Engelmann P Vanna Sailer

## 2020-01-04 ENCOUNTER — Other Ambulatory Visit: Payer: Self-pay

## 2020-01-04 LAB — CULTURE, BLOOD (SINGLE)
Culture: NO GROWTH
Special Requests: ADEQUATE

## 2020-01-05 LAB — AEROBIC/ANAEROBIC CULTURE W GRAM STAIN (SURGICAL/DEEP WOUND): Culture: NO GROWTH

## 2020-01-05 LAB — SURGICAL PATHOLOGY

## 2020-01-07 MED FILL — ALLOPURINOL 100 MG TABS: 100 | 90 days supply | Qty: 90 | Fill #1

## 2020-01-08 MED FILL — ATORVASTATIN 10 MG TABLET: 10 | 90 days supply | Qty: 90 | Fill #1

## 2020-02-25 MED FILL — ALLOPURINOL 100 MG TABS: 100 | 90 days supply | Qty: 90 | Fill #0

## 2020-03-07 MED FILL — predniSONE 10 MG (21) TBPK: 10 | 6 days supply | Qty: 21 | Fill #0

## 2020-03-23 ENCOUNTER — Other Ambulatory Visit (HOSPITAL_COMMUNITY): Payer: Self-pay | Admitting: Internal Medicine

## 2020-03-23 MED FILL — ALLOPURINOL 300 MG TABS: 300 | 90 days supply | Qty: 90 | Fill #0

## 2020-03-23 MED FILL — predniSONE 10 MG (21) TBPK: 10 | 6 days supply | Qty: 21 | Fill #0

## 2020-04-27 ENCOUNTER — Other Ambulatory Visit (HOSPITAL_COMMUNITY): Payer: Self-pay | Admitting: Internal Medicine

## 2020-04-27 MED FILL — ATORVASTATIN CALCIUM 10 MG: 10 | 90 days supply | Qty: 90 | Fill #0

## 2020-05-16 MED FILL — ATORVASTATIN CALCIUM 10 MG: 10 | 90 days supply | Qty: 90 | Fill #0

## 2020-07-02 MED FILL — ALLOPURINOL 300 MG TABS: 300 | 90 days supply | Qty: 90 | Fill #1

## 2020-10-07 MED FILL — ALLOPURINOL 300 MG TABS: 300 | 90 days supply | Qty: 90 | Fill #2

## 2020-10-07 MED FILL — ATORVASTATIN CALCIUM 10 MG: 10 | 90 days supply | Qty: 90 | Fill #1

## 2021-01-02 ENCOUNTER — Other Ambulatory Visit (HOSPITAL_COMMUNITY): Payer: Self-pay

## 2021-01-02 ENCOUNTER — Other Ambulatory Visit: Payer: Self-pay | Admitting: Internal Medicine

## 2021-01-02 MED FILL — Atorvastatin Calcium Tab 10 MG (Base Equivalent): ORAL | 90 days supply | Qty: 90 | Fill #0 | Status: AC

## 2021-01-03 ENCOUNTER — Other Ambulatory Visit (HOSPITAL_COMMUNITY): Payer: Self-pay

## 2021-01-03 MED ORDER — ALLOPURINOL 300 MG PO TABS
ORAL_TABLET | ORAL | 3 refills | Status: DC
  Filled 2021-01-03: qty 90, 90d supply, fill #0
  Filled 2021-03-15: qty 90, 90d supply, fill #1
  Filled 2021-06-20: qty 90, 90d supply, fill #2
  Filled 2021-10-04: qty 90, 90d supply, fill #3

## 2021-01-19 ENCOUNTER — Other Ambulatory Visit (HOSPITAL_COMMUNITY): Payer: Self-pay

## 2021-03-15 ENCOUNTER — Other Ambulatory Visit (HOSPITAL_COMMUNITY): Payer: Self-pay

## 2021-06-20 ENCOUNTER — Other Ambulatory Visit (HOSPITAL_COMMUNITY): Payer: Self-pay

## 2021-06-20 ENCOUNTER — Other Ambulatory Visit: Payer: Self-pay

## 2021-06-20 MED ORDER — ATORVASTATIN CALCIUM 10 MG PO TABS
ORAL_TABLET | ORAL | 3 refills | Status: AC
  Filled 2021-06-20 – 2021-10-04 (×2): qty 90, 90d supply, fill #0
  Filled 2022-01-31: qty 90, 90d supply, fill #1

## 2021-06-20 MED ORDER — ATORVASTATIN CALCIUM 10 MG PO TABS
ORAL_TABLET | ORAL | 3 refills | Status: AC
  Filled 2021-06-20: qty 90, 90d supply, fill #0

## 2021-10-04 ENCOUNTER — Other Ambulatory Visit (HOSPITAL_COMMUNITY): Payer: Self-pay

## 2021-12-31 IMAGING — MR MR ELBOW*R* WO/W CM
6 of 11 series · 23 of 40 positions shown · IV contrast (gadavist)
Comparison: None.

CLINICAL DATA: Pain, swelling and inflammation involving the elbow.

EXAM:
MRI OF THE RIGHT ELBOW WITHOUT AND WITH CONTRAST
TECHNIQUE: Multiplanar, multisequence MR imaging of the elbow was performed
before and after the administration of intravenous contrast.
CONTRAST:  10mL GADAVIST GADOBUTROL 1 MMOL/ML IV SOLN

[Series 7: T1 · axial · right · 3.0mm · 0.25mm/px · z∈[-79,+57]mm · 5 of 35 slices shown (1 of 2)]
[im 1/35]
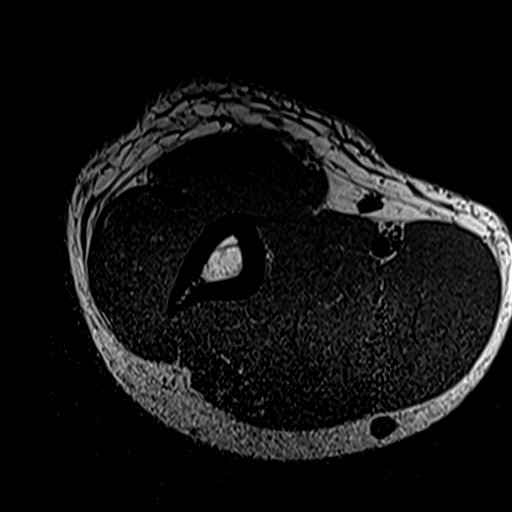
[im 9/35]
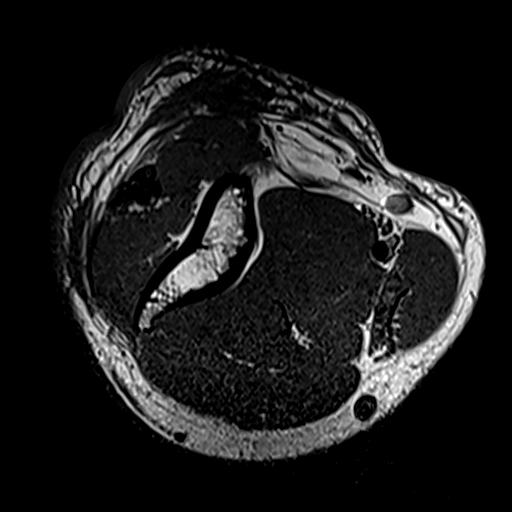
[im 18/35]
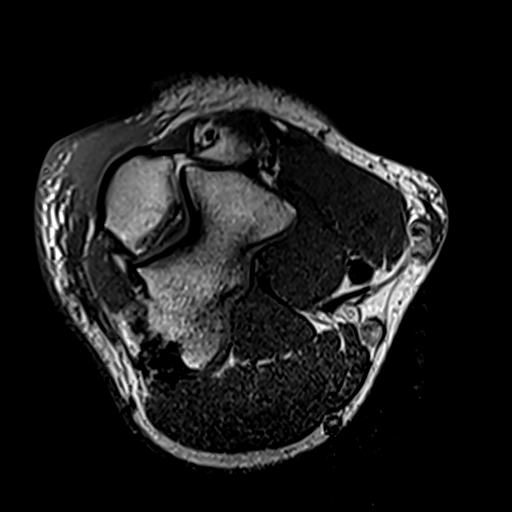
[im 26/35]
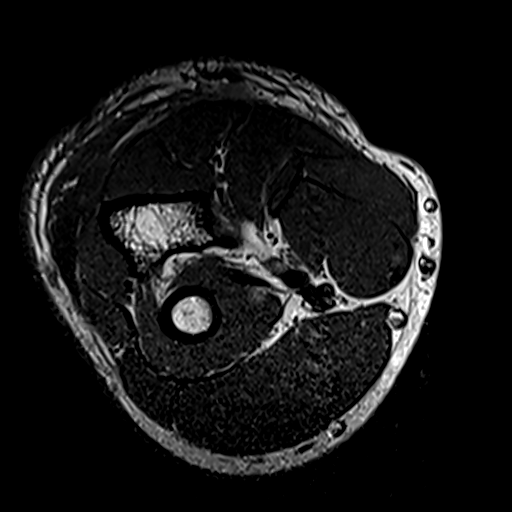
[im 35/35]
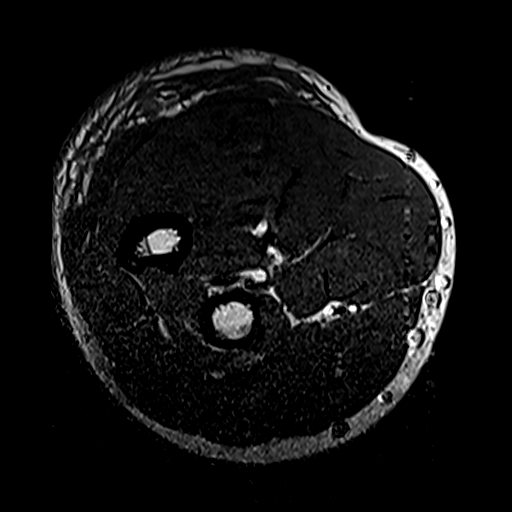

[Series 8: T2 fat-sat · axial · right · 3.0mm · 0.25mm/px · z∈[-79,+57]mm · 5 of 35 slices shown (1 of 3)]
[im 1/35]
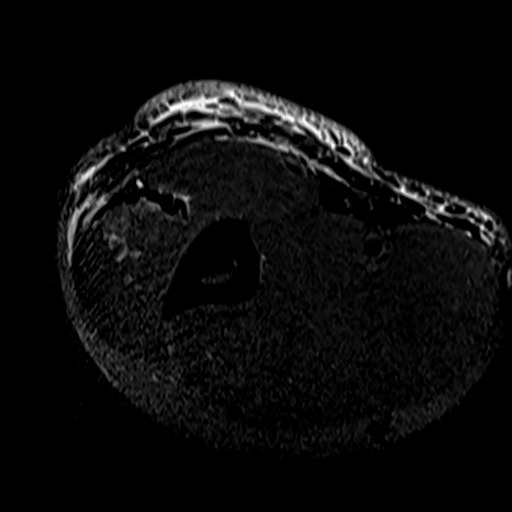
[im 9/35]
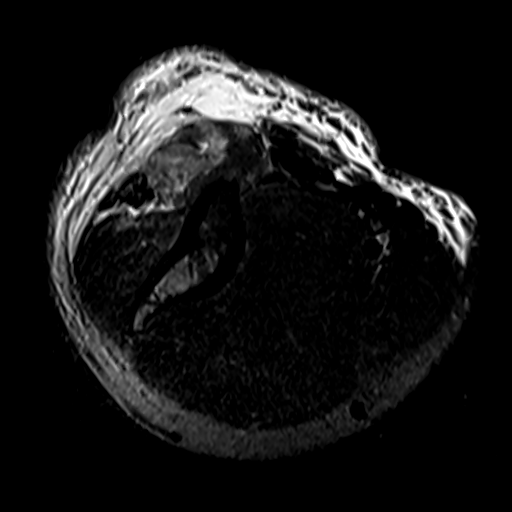
[im 18/35]
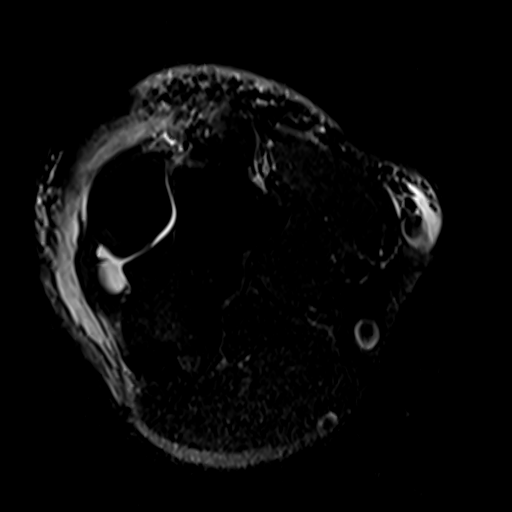
[im 26/35]
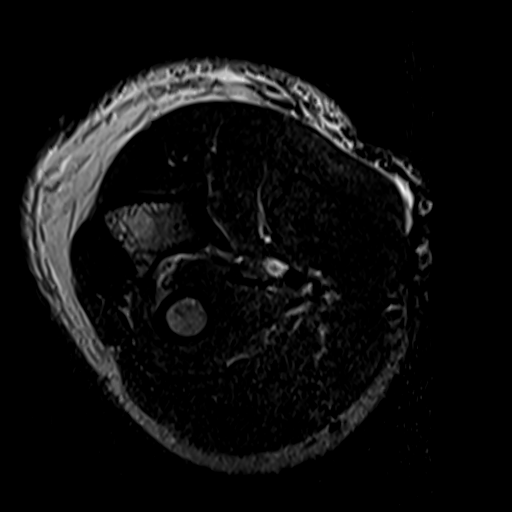
[im 35/35]
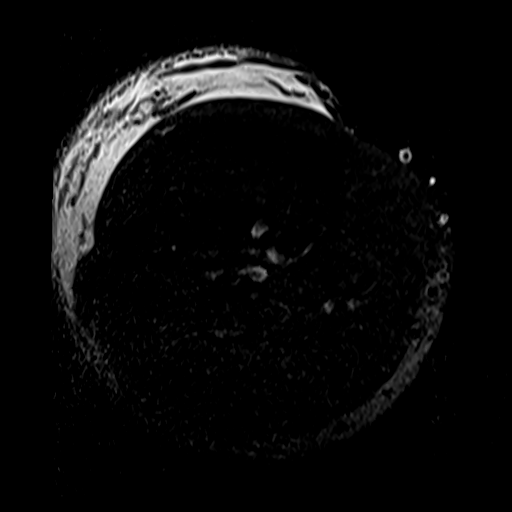

[Series 9: T2 fat-sat · sagittal · right · 3.0mm · 0.27mm/px · 3 of 27 slices shown (2 of 3)]
[im 1/27]
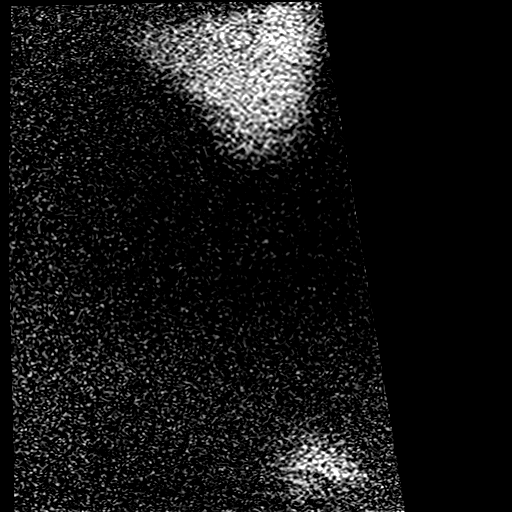
[im 14/27]
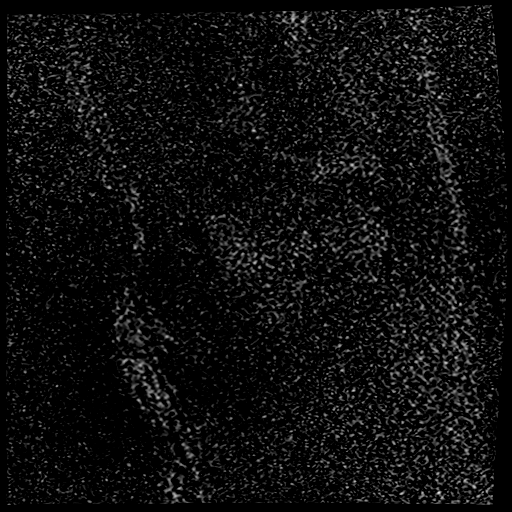
[im 27/27]
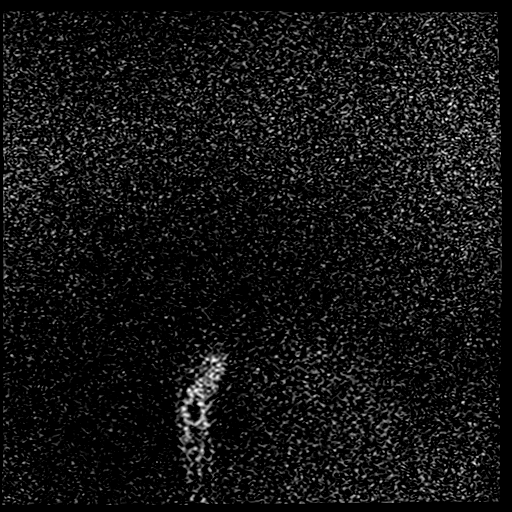

[Series 10: T1 · sagittal · right · 3.0mm · 0.62mm/px · 3 of 27 slices shown (2 of 2)]
[im 1/27]
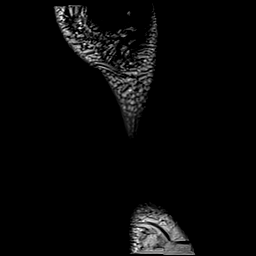
[im 14/27]
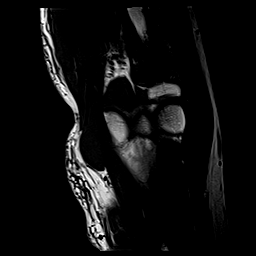
[im 27/27]
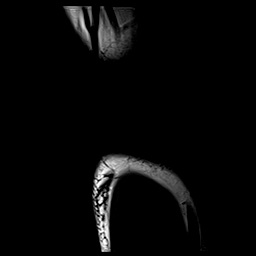

[Series 11: T2 fat-sat · sagittal · right · 3.0mm · 0.27mm/px · 3 of 27 slices shown (3 of 3)]
[im 1/27]
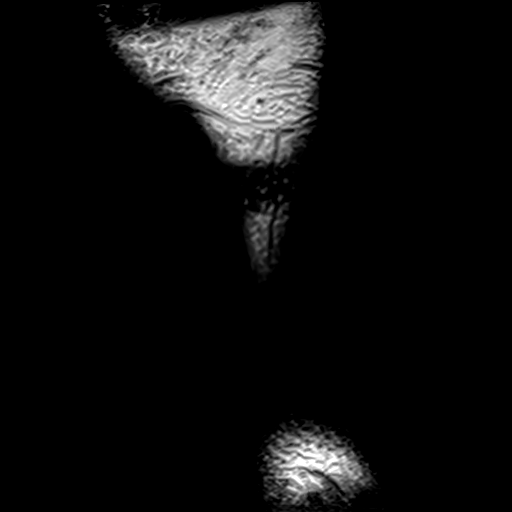
[im 14/27]
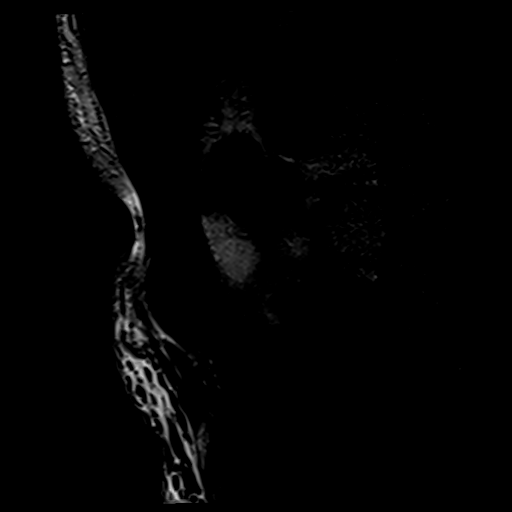
[im 27/27]
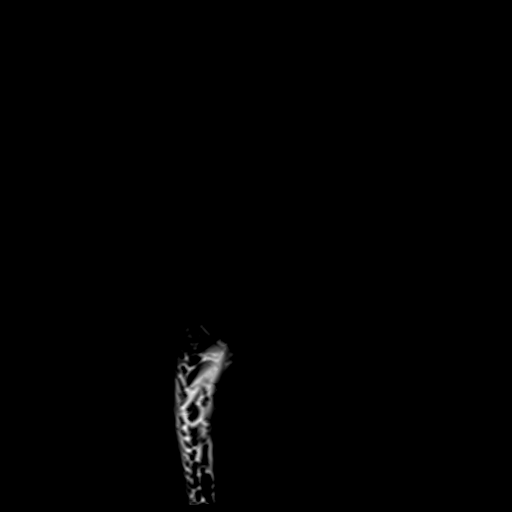

[Series 12: PD fat-sat · coronal · right · 3.0mm · 0.47mm/px · 4 of 32 slices shown]
[im 1/32]
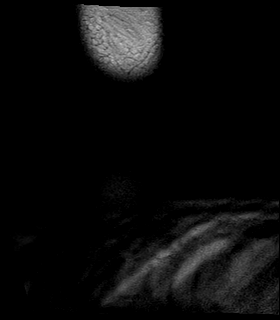
[im 11/32]
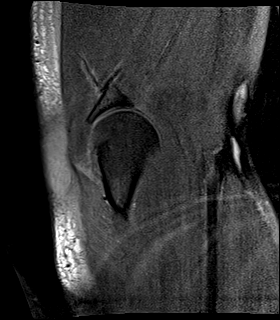
[im 21/32]
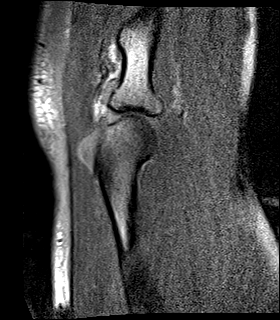
[im 32/32]
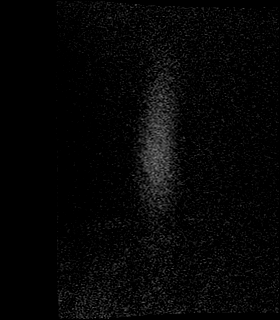

[23 of 40 positions shown; findings below may reference images not displayed]

FINDINGS: Fairly significant subcutaneous soft tissue swelling/edema/fluid
involving the posterior and medial aspect of the elbow, lower
humeral area and proximal forearm. Findings consistent with
significant cellulitis. There is also a small rim enhancing fluid
collection in the region of the olecranon bursa suspicious for small
abscess or septic bursitis. This measures a maximum of 2.2 cm and
may be the epicenter of this process.

There is also a small focal fluid collection in the medial
antecubital region which also demonstrates rim enhancement after
contrast administration and is consistent with a small subcutaneous
abscess measuring a maximum of 18 mm.

Moderate inflammation/edema involving the triceps musculature
distally and mainly medially consistent with myositis. No findings
suspicious for pyomyositis. There is also some signal abnormality in
the triceps tendon which could be inflammatory/infectious
tendinopathy.

I do not see any findings suspicious for osteomyelitis. There is a
small elbow joint effusion but I do not see any worrisome
enhancement to suggest septic arthritis.
IMPRESSION: 1. Diffuse cellulitis. There is also myositis involving the medial
triceps musculature. Possible infectious tendinopathy involving the
triceps tendon.
2. Small subcutaneous abscess or septic bursitis involving the
olecranon bursa.
3. Small second small subcutaneous abscess involving the medial
antecubital region.
4. No findings for pyomyositis, septic arthritis or osteomyelitis.

## 2022-01-10 ENCOUNTER — Other Ambulatory Visit (HOSPITAL_COMMUNITY): Payer: Self-pay

## 2022-01-31 ENCOUNTER — Other Ambulatory Visit (HOSPITAL_COMMUNITY): Payer: Self-pay

## 2022-02-01 ENCOUNTER — Other Ambulatory Visit (HOSPITAL_COMMUNITY): Payer: Self-pay

## 2022-02-01 MED ORDER — ALLOPURINOL 300 MG PO TABS
300.0000 mg | ORAL_TABLET | Freq: Every day | ORAL | 3 refills | Status: AC
  Filled 2022-02-01: qty 90, 90d supply, fill #0

## 2023-08-16 ENCOUNTER — Telehealth: Payer: Self-pay | Admitting: *Deleted

## 2023-08-16 DIAGNOSIS — Z136 Encounter for screening for cardiovascular disorders: Secondary | ICD-10-CM

## 2023-08-19 NOTE — Telephone Encounter (Signed)
Spoke with pt and reviewed the order for Ca score, instructions and cost.  Pt prefers the [facility] location.  He is aware he will be contacted to be scheduled.  He will call back if any questions or concerns prior to then.

## 2023-08-26 ENCOUNTER — Ambulatory Visit (HOSPITAL_COMMUNITY)
Admission: RE | Admit: 2023-08-26 | Discharge: 2023-08-26 | Disposition: A | Discharge status: Home / Self Care | Payer: Self-pay | Source: Ambulatory Visit | Attending: Cardiology | Admitting: Cardiology

## 2023-08-26 DIAGNOSIS — Z136 Encounter for screening for cardiovascular disorders: Secondary | ICD-10-CM | POA: Insufficient documentation

## 2023-09-05 ENCOUNTER — Telehealth: Payer: Self-pay | Admitting: *Deleted

## 2023-09-05 NOTE — Telephone Encounter (Signed)
Calcium score is 9.6. Overall mild amount of calcified plaque but still higher than expected given your age. Given family history (father early MI), would shoot for LDL goal of < 70. Let's have him check lipid panel, CMET, CBC and Lp(a) and have him come in to see me in clinic for a visit. My team will be in touch.   per Dr Donato Schultz  Attempted to contact pt.  Left message to call back to discuss lab orders and to schedule f/u with Dr Anne Fu.  He will need to be double-booked on [facility]' schedule.

## 2023-09-10 NOTE — Telephone Encounter (Signed)
Reviewed results with pt who has seen them.  He reports his PCP recently completed a panel of labs and he will forward those results to Dr Anne Fu for review prior to having any further labs at this time.  He would like for Dr Anne Fu to review those labs before scheduling follow up at this time as well.  Fax # provided to send lab results to Dr Anne Fu.
